# Patient Record
Sex: Female | Born: 2000 | Hispanic: Yes | Smoking: Former smoker
Health system: Southern US, Community
[De-identification: ages and names within clinical notes are randomized; demographics above are authoritative.]

## PROBLEM LIST (undated history)

## (undated) ENCOUNTER — Inpatient Hospital Stay (HOSPITAL_COMMUNITY): Payer: Self-pay

## (undated) DIAGNOSIS — A749 Chlamydial infection, unspecified: Secondary | ICD-10-CM

## (undated) DIAGNOSIS — Z789 Other specified health status: Secondary | ICD-10-CM

## (undated) HISTORY — PX: NO PAST SURGERIES: SHX2092

## (undated) HISTORY — DX: Chlamydial infection, unspecified: A74.9

---
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
## 2019-04-06 IMAGING — US US MFM FETAL NUCHAL TRANSLUCENCY
1 series · 14 of 28 positions shown · non-contrast
Comparison: none

[Series 1: us mfm fetal nuchal translucency · 14 of 43 slices shown]
[im 2/43]
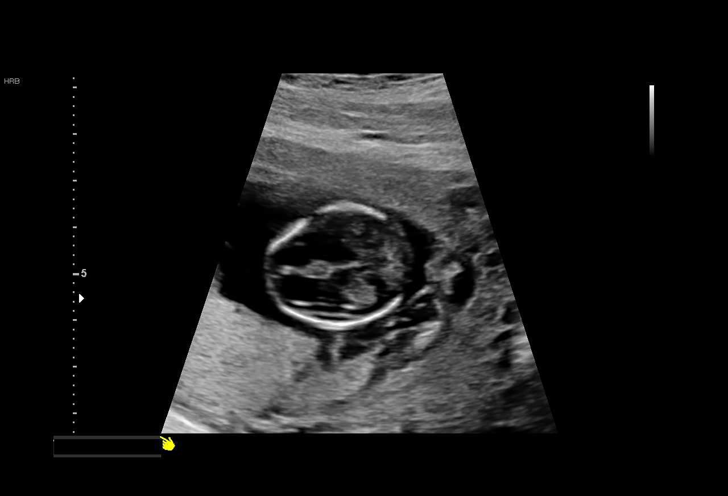
[im 5/43]
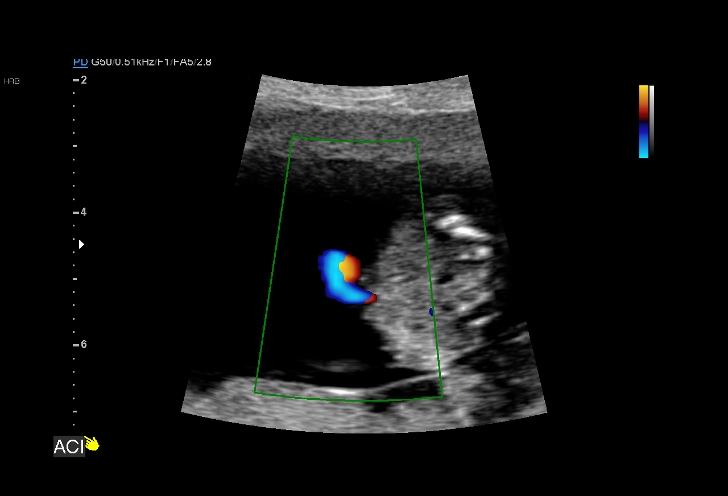
[im 8/43]
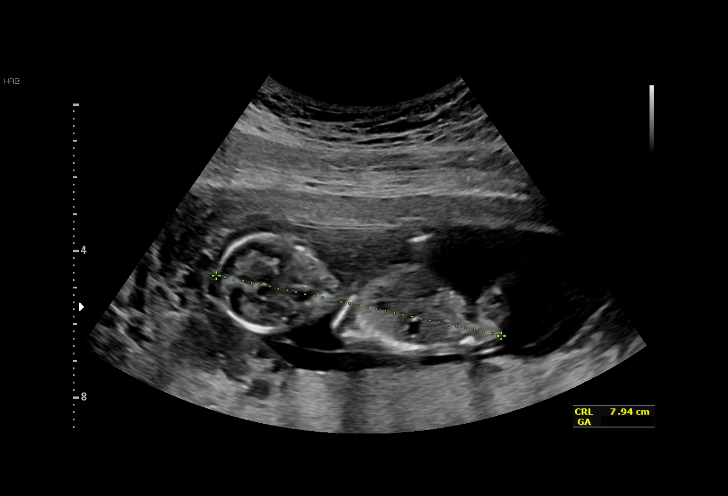
[im 11/43]
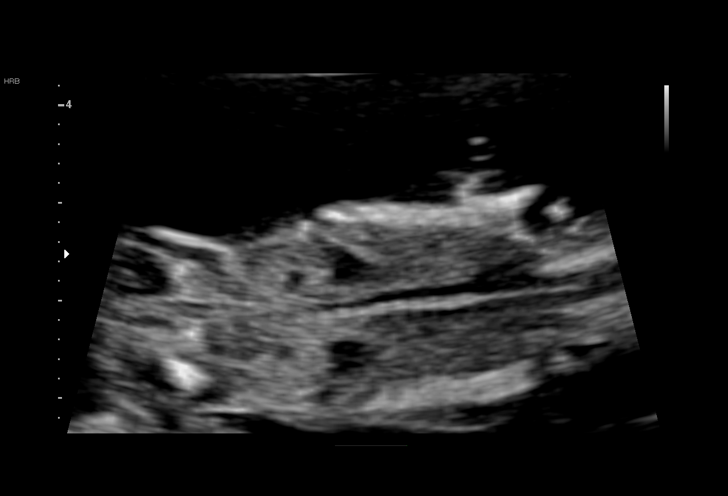
[im 15/43]
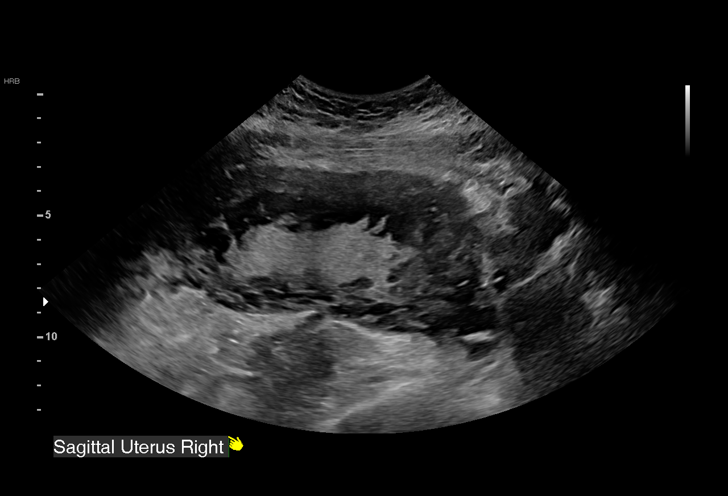
[im 18/43]
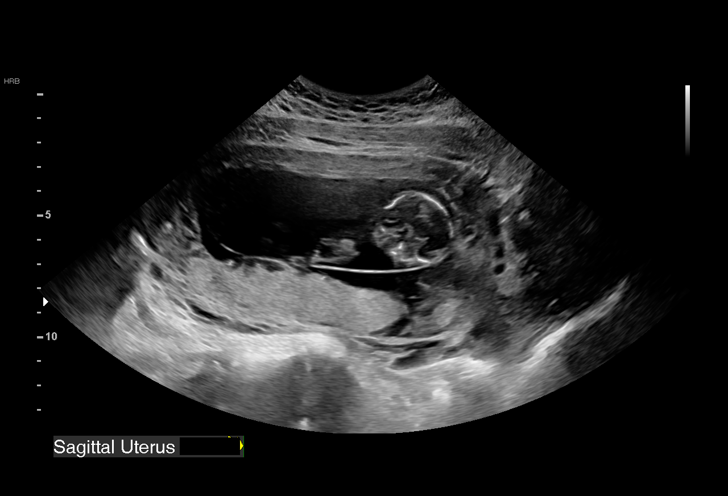
[im 21/43]
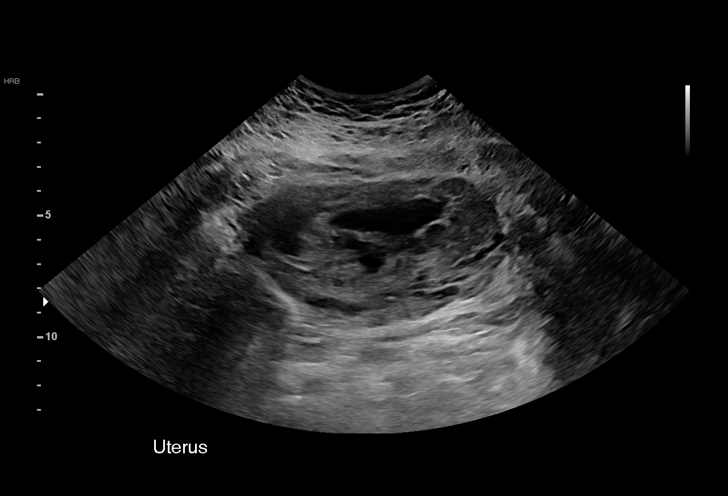
[im 24/43]
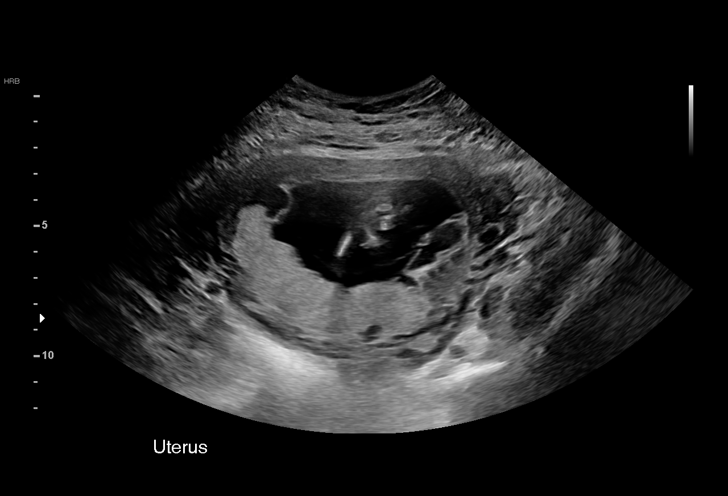
[im 27/43]
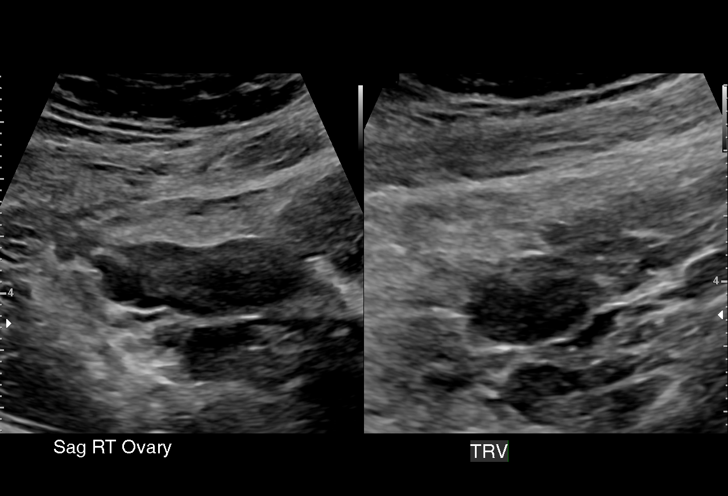
[im 30/43]
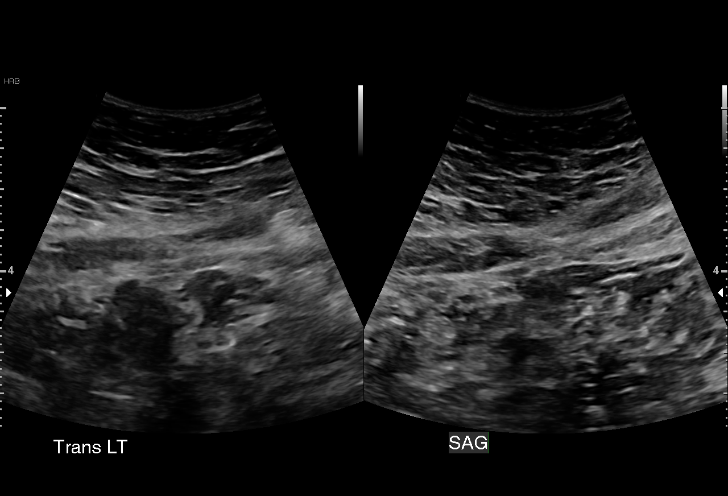
[im 33/43]
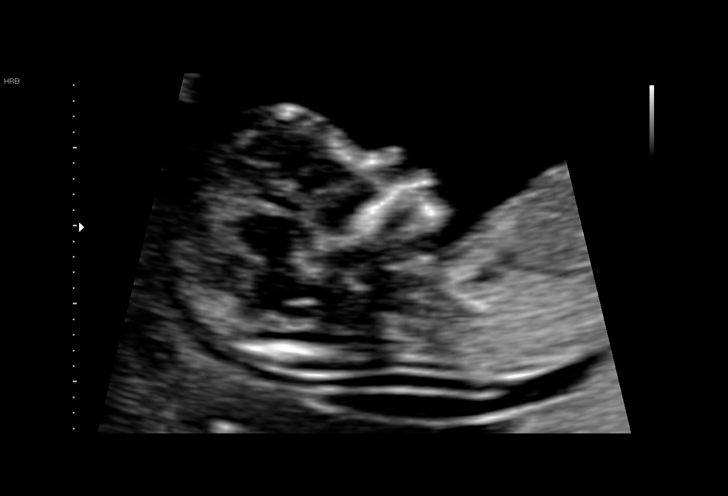
[im 36/43]
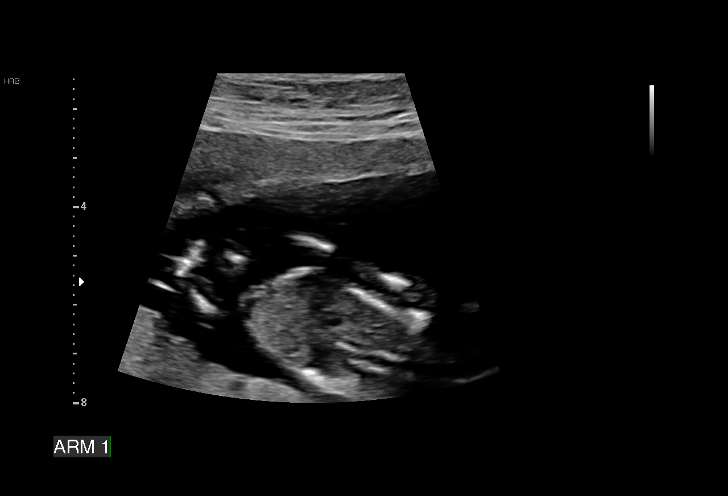
[im 39/43]
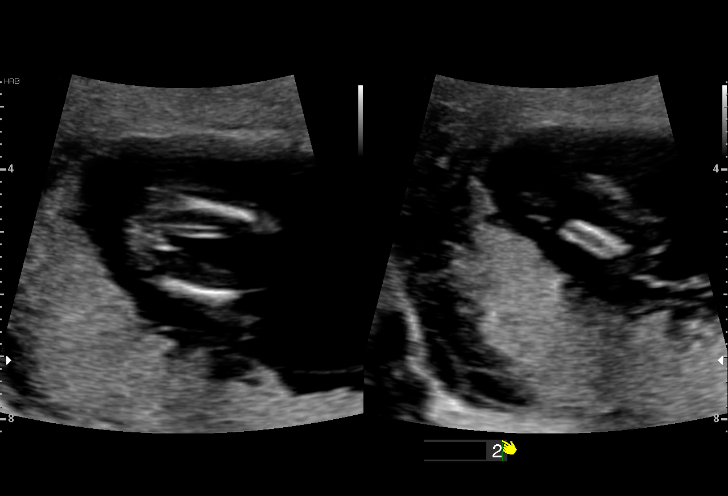
[im 43/43]
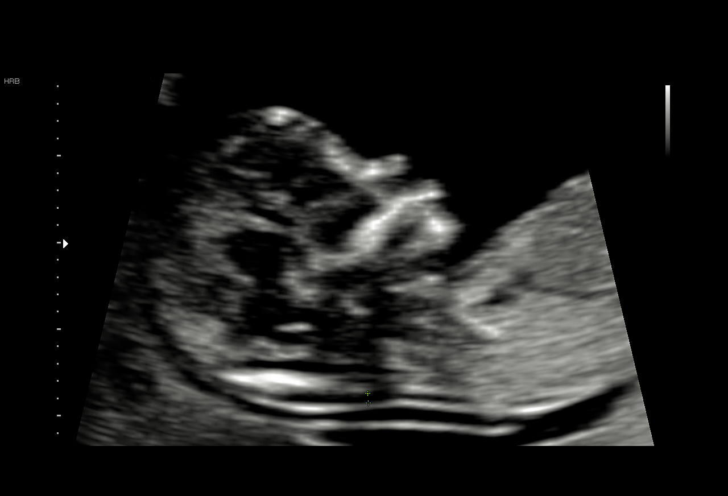

[14 of 28 positions shown; findings below may reference images not displayed]

SINGH GARY

GILBERG NP

TRANSLUCENCY

Indications

13 weeks gestation of pregnancy
Encounter for nuchal translucency
Short interval between pregancies, 1st
trimester
Vital Signs

BMI:
Fetal Evaluation

Num Of Fetuses:         1
Fetal Heart Rate(bpm):  162
Cardiac Activity:       Observed

Amniotic Fluid
AFI FV:      Within normal limits
Biometry

CRL:      78.8  mm     G. Age:  13w 4d                  EDD:   11/22/18
OB History

Gravidity:    2         Term:   0         SAB:   1
Living:       0
Gestational Age
LMP:           13w 0d        Date:  02/19/18                 EDD:   11/26/18
Best:          13w 0d     Det. By:  LMP  (02/19/18)          EDD:   11/26/18
1st Trimester Genetic Sonogram Screening

CRL:            78.8  mm    G. Age:   13w 4d                 EDD:   11/22/18
Nuc Trans:       1.2  mm

Nasal Bone:                 Present
Anatomy

Cranium:               Appears normal         Abdomen:                Appears normal
Cavum:                 Appears normal         Abdominal Wall:         Appears nml (cord
insert, abd wall)
Choroid Plexus:        Appears normal         Cord Vessels:           Appears normal (3
vessel cord)
Thoracic:              Appears normal         Kidneys:                Appear normal
Stomach:               Appears normal, left   Bladder:                Appears normal
sided
Cervix Uterus Adnexa

Cervix
Normal appearance by transabdominal scan.

Uterus
No abnormality visualized.

Left Ovary
Not visualized. No adnexal mass visualized.

Right Ovary
Size(cm)     3.63   x   1.34   x  1.76      Vol(ml):
Within normal limits.

Cul De Sac
No free fluid seen.

Adnexa
No abnormality visualized.
Impression

On ultrasound, the CRL measurement is consistent with her
previously-established dates and good fetal heart activity is
seen. The nuchal translucency (NT) measures
millimeters, which is normal.  Fetal anatomy that could be
ascertained at this gestational age is normal.

Blood was drawn for LICHT and beta hCG.

We will communicate first-trimester screening results to the
patient.
Recommendations

Fetal anatomy scan at 20 weeks.

## 2020-09-22 NOTE — L&D Delivery Note (Signed)
OB/GYN Faculty Practice Delivery Note  Sara Stokes is a 20 y.o. now [id number] s/p SVD at [redacted]w[redacted]d who was admitted for SROM @ 0630.   ROM: 9h 97m with clear fluid GBS Status: Positive Maximum Maternal Temperature: 99.1  Delivery Note At 4:22 PM a viable female was delivered via Vaginal, Spontaneous (Presentation: Left Occiput Anterior). Tight nuchal cord, unable to reduce until delivery of head and shoulders.  APGAR: 9, 9; weight - pending. Gentle cord traction applied until blood noted to be collecting in between membranes and placenta body. At which point patient was instructed to push for delivery of placenta.   At 4:30 PM placenta pushed out spontaneously partially adherent to an inverted uterus. *Drs. Eckstat and Pickens called to come to bedside stat for inverted uterus. Placenta to pathology.  Cord: 3 vessels with the following complications: None.  Cord pH: n/a.   Anesthesia: Epidural Episiotomy: None Lacerations: None Suture Repair: N/A Est. Blood Loss (mL): 2000  Postpartum Planning [x]  Mom to postpartum.  Baby to Couplet care / Skin to Skin.  [x]  plans to breast/bottle feed Declined vaccines  *See documentation by Dr. regarding replacement of inverted uterus and treatment of hemorrhage.  , MSN, CNM 10/10/20, 5:16 PM

## 2020-10-10 ENCOUNTER — Observation Stay (HOSPITAL_COMMUNITY): Payer: Medicaid Other | Admitting: Anesthesiology

## 2020-10-10 ENCOUNTER — Inpatient Hospital Stay (HOSPITAL_COMMUNITY)
Admission: AD | Admit: 2020-10-10 | Discharge: 2020-10-11 | DRG: 806 | Disposition: A | Discharge status: Home / Self Care | Payer: Medicaid Other | Attending: Family Medicine | Admitting: Family Medicine

## 2020-10-10 ENCOUNTER — Other Ambulatory Visit: Payer: Self-pay

## 2020-10-10 ENCOUNTER — Encounter (HOSPITAL_COMMUNITY): Payer: Self-pay | Admitting: Obstetrics and Gynecology

## 2020-10-10 DIAGNOSIS — O4292 Full-term premature rupture of membranes, unspecified as to length of time between rupture and onset of labor: Secondary | ICD-10-CM | POA: Diagnosis present

## 2020-10-10 DIAGNOSIS — Z23 Encounter for immunization: Secondary | ICD-10-CM | POA: Diagnosis not present

## 2020-10-10 DIAGNOSIS — O99824 Streptococcus B carrier state complicating childbirth: Secondary | ICD-10-CM | POA: Diagnosis present

## 2020-10-10 DIAGNOSIS — Z3A38 38 weeks gestation of pregnancy: Secondary | ICD-10-CM

## 2020-10-10 DIAGNOSIS — Z20822 Contact with and (suspected) exposure to covid-19: Secondary | ICD-10-CM | POA: Diagnosis present

## 2020-10-10 DIAGNOSIS — O26893 Other specified pregnancy related conditions, third trimester: Secondary | ICD-10-CM | POA: Diagnosis present

## 2020-10-10 DIAGNOSIS — N855 Inversion of uterus: Secondary | ICD-10-CM | POA: Diagnosis not present

## 2020-10-10 DIAGNOSIS — Z8759 Personal history of other complications of pregnancy, childbirth and the puerperium: Secondary | ICD-10-CM | POA: Diagnosis not present

## 2020-10-10 DIAGNOSIS — Z3490 Encounter for supervision of normal pregnancy, unspecified, unspecified trimester: Secondary | ICD-10-CM | POA: Diagnosis present

## 2020-10-10 DIAGNOSIS — O4202 Full-term premature rupture of membranes, onset of labor within 24 hours of rupture: Secondary | ICD-10-CM

## 2020-10-10 DIAGNOSIS — O99820 Streptococcus B carrier state complicating pregnancy: Secondary | ICD-10-CM

## 2020-10-10 DIAGNOSIS — Z87891 Personal history of nicotine dependence: Secondary | ICD-10-CM

## 2020-10-10 LAB — URINALYSIS, ROUTINE W REFLEX MICROSCOPIC
Bilirubin Urine: NEGATIVE
Glucose, UA: NEGATIVE mg/dL
Ketones, ur: NEGATIVE mg/dL
Nitrite: NEGATIVE
Protein, ur: NEGATIVE mg/dL
Specific Gravity, Urine: 1.016 (ref 1.005–1.030)
pH: 6 (ref 5.0–8.0)

## 2020-10-10 LAB — CBC
HCT: 40.1 % (ref 36.0–46.0)
HCT: 32.3 % — ABNORMAL LOW (ref 36.0–46.0)
HCT: 32.6 % — ABNORMAL LOW (ref 36.0–46.0)
Hemoglobin: 13.1 g/dL (ref 12.0–15.0)
Hemoglobin: 11 g/dL — ABNORMAL LOW (ref 12.0–15.0)
Hemoglobin: 11.5 g/dL — ABNORMAL LOW (ref 12.0–15.0)
MCH: 29.2 pg (ref 26.0–34.0)
MCH: 30.6 pg (ref 26.0–34.0)
MCH: 30.8 pg (ref 26.0–34.0)
MCHC: 32.7 g/dL (ref 30.0–36.0)
MCHC: 34.1 g/dL (ref 30.0–36.0)
MCHC: 35.3 g/dL (ref 30.0–36.0)
MCV: 89.5 fL (ref 80.0–100.0)
MCV: 89.7 fL (ref 80.0–100.0)
MCV: 87.4 fL (ref 80.0–100.0)
Platelets: 241 10*3/uL (ref 150–400)
Platelets: 248 10*3/uL (ref 150–400)
Platelets: 187 10*3/uL (ref 150–400)
RBC: 4.48 MIL/uL (ref 3.87–5.11)
RBC: 3.6 MIL/uL — ABNORMAL LOW (ref 3.87–5.11)
RBC: 3.73 MIL/uL — ABNORMAL LOW (ref 3.87–5.11)
RDW: 12.9 % (ref 11.5–15.5)
RDW: 12.9 % (ref 11.5–15.5)
RDW: 13.2 % (ref 11.5–15.5)
WBC: 11.1 10*3/uL — ABNORMAL HIGH (ref 4.0–10.5)
WBC: 12 10*3/uL — ABNORMAL HIGH (ref 4.0–10.5)
WBC: 18.4 10*3/uL — ABNORMAL HIGH (ref 4.0–10.5)
nRBC: 0 % (ref 0.0–0.2)
nRBC: 0 % (ref 0.0–0.2)
nRBC: 0 % (ref 0.0–0.2)

## 2020-10-10 LAB — OB RESULTS CONSOLE RUBELLA ANTIBODY, IGM: Rubella: NON-IMMUNE/NOT IMMUNE

## 2020-10-10 LAB — OB RESULTS CONSOLE HIV ANTIBODY (ROUTINE TESTING): HIV: NONREACTIVE

## 2020-10-10 LAB — COMPREHENSIVE METABOLIC PANEL WITH GFR
ALT: 23 U/L (ref 0–44)
AST: 24 U/L (ref 15–41)
Albumin: 2.6 g/dL — ABNORMAL LOW (ref 3.5–5.0)
Alkaline Phosphatase: 181 U/L — ABNORMAL HIGH (ref 38–126)
Anion gap: 9 (ref 5–15)
BUN: 7 mg/dL (ref 6–20)
CO2: 18 mmol/L — ABNORMAL LOW (ref 22–32)
Calcium: 8.5 mg/dL — ABNORMAL LOW (ref 8.9–10.3)
Chloride: 107 mmol/L (ref 98–111)
Creatinine, Ser: 0.46 mg/dL (ref 0.44–1.00)
GFR, Estimated: >60 mL/min
Glucose, Bld: 98 mg/dL (ref 70–99)
Potassium: 3.8 mmol/L (ref 3.5–5.1)
Sodium: 134 mmol/L — ABNORMAL LOW (ref 135–145)
Total Bilirubin: 0.6 mg/dL (ref 0.3–1.2)
Total Protein: 6.3 g/dL — ABNORMAL LOW (ref 6.5–8.1)

## 2020-10-10 LAB — POCT FERN TEST: POCT Fern Test: POSITIVE

## 2020-10-10 LAB — PROTIME-INR
INR: 1.1 (ref 0.8–1.2)
Prothrombin Time: 13.5 seconds (ref 11.4–15.2)

## 2020-10-10 LAB — PREPARE RBC (CROSSMATCH)

## 2020-10-10 LAB — RESP PANEL BY RT-PCR (FLU A&B, COVID) ARPGX2
Influenza A by PCR: NEGATIVE
Influenza B by PCR: NEGATIVE
SARS Coronavirus 2 by RT PCR: NEGATIVE

## 2020-10-10 LAB — OB RESULTS CONSOLE RPR: RPR: NONREACTIVE

## 2020-10-10 LAB — OB RESULTS CONSOLE ANTIBODY SCREEN: Antibody Screen: NEGATIVE

## 2020-10-10 LAB — OB RESULTS CONSOLE ABO/RH: RH Type: POSITIVE

## 2020-10-10 LAB — SAVE SMEAR(SSMR), FOR PROVIDER SLIDE REVIEW

## 2020-10-10 LAB — FIBRINOGEN: Fibrinogen: 530 mg/dL — ABNORMAL HIGH (ref 210–475)

## 2020-10-10 LAB — OB RESULTS CONSOLE HEPATITIS B SURFACE ANTIGEN: Hepatitis B Surface Ag: NEGATIVE

## 2020-10-10 LAB — PROTEIN / CREATININE RATIO, URINE
Creatinine, Urine: 28.67 mg/dL
Total Protein, Urine: <6 mg/dL

## 2020-10-10 LAB — OB RESULTS CONSOLE GBS: GBS: POSITIVE

## 2020-10-10 LAB — RPR: RPR Ser Ql: NONREACTIVE

## 2020-10-10 LAB — APTT: aPTT: 26 seconds (ref 24–36)

## 2020-10-10 MED ORDER — AMMONIA AROMATIC IN INHA
RESPIRATORY_TRACT | Status: AC
  Filled 2020-10-10: qty 10

## 2020-10-10 MED ORDER — CEFAZOLIN SODIUM-DEXTROSE 2-4 GM/100ML-% IV SOLN
2.0000 g | Freq: Once | INTRAVENOUS | Status: AC
  Administered 2020-10-10: 2 g via INTRAVENOUS
  Filled 2020-10-10: qty 100

## 2020-10-10 MED ORDER — PRENATAL MULTIVITAMIN CH
1.0000 | ORAL_TABLET | Freq: Every day | ORAL | Status: DC
  Administered 2020-10-11: 1 via ORAL
  Filled 2020-10-10: qty 1

## 2020-10-10 MED ORDER — FERROUS SULFATE 325 (65 FE) MG PO TABS
325.0000 mg | ORAL_TABLET | Freq: Two times a day (BID) | ORAL | Status: DC
  Administered 2020-10-11 (×2): 325 mg via ORAL
  Filled 2020-10-10 (×2): qty 1

## 2020-10-10 MED ORDER — TRANEXAMIC ACID-NACL 1000-0.7 MG/100ML-% IV SOLN
INTRAVENOUS | Status: AC
  Filled 2020-10-10: qty 100

## 2020-10-10 MED ORDER — COCONUT OIL OIL: 1.0000 "application " | TOPICAL_OIL | Status: DC | PRN

## 2020-10-10 MED ORDER — SODIUM CHLORIDE 0.9 % IV SOLN
5.0000 10*6.[IU] | Freq: Once | INTRAVENOUS | Status: AC
  Administered 2020-10-10: 5 10*6.[IU] via INTRAVENOUS
  Filled 2020-10-10: qty 5

## 2020-10-10 MED ORDER — PENICILLIN G POT IN DEXTROSE 60000 UNIT/ML IV SOLN: 3.0000 10*6.[IU] | INTRAVENOUS | Status: DC

## 2020-10-10 MED ORDER — METHYLERGONOVINE MALEATE 0.2 MG PO TABS
0.2000 mg | ORAL_TABLET | Freq: Four times a day (QID) | ORAL | Status: AC
  Administered 2020-10-10 – 2020-10-11 (×3): 0.2 mg via ORAL
  Filled 2020-10-10 (×3): qty 1

## 2020-10-10 MED ORDER — FENTANYL-BUPIVACAINE-NACL 0.5-0.125-0.9 MG/250ML-% EP SOLN
12.0000 mL/h | EPIDURAL | Status: DC | PRN
Start: 2020-10-10 — End: 2020-10-10
  Filled 2020-10-10: qty 250

## 2020-10-10 MED ORDER — METHYLERGONOVINE MALEATE 0.2 MG/ML IJ SOLN
0.2000 mg | Freq: Once | INTRAMUSCULAR | Status: AC
  Administered 2020-10-10: 0.2 mg via INTRAMUSCULAR

## 2020-10-10 MED ORDER — DIBUCAINE (PERIANAL) 1 % EX OINT: 1.0000 "application " | TOPICAL_OINTMENT | CUTANEOUS | Status: DC | PRN

## 2020-10-10 MED ORDER — FENTANYL-BUPIVACAINE-NACL 0.5-0.125-0.9 MG/250ML-% EP SOLN: 12.0000 mL/h | EPIDURAL | Status: DC | PRN

## 2020-10-10 MED ORDER — OXYTOCIN-SODIUM CHLORIDE 30-0.9 UT/500ML-% IV SOLN
2.5000 [IU]/h | INTRAVENOUS | Status: DC
  Filled 2020-10-10: qty 500

## 2020-10-10 MED ORDER — ONDANSETRON HCL 4 MG PO TABS: 4.0000 mg | ORAL_TABLET | ORAL | Status: DC | PRN

## 2020-10-10 MED ORDER — ZOLPIDEM TARTRATE 5 MG PO TABS: 5.0000 mg | ORAL_TABLET | Freq: Every evening | ORAL | Status: DC | PRN

## 2020-10-10 MED ORDER — ACETAMINOPHEN 325 MG PO TABS: 650.0000 mg | ORAL_TABLET | ORAL | Status: DC | PRN

## 2020-10-10 MED ORDER — OXYCODONE-ACETAMINOPHEN 5-325 MG PO TABS: 1.0000 | ORAL_TABLET | ORAL | Status: DC | PRN

## 2020-10-10 MED ORDER — PHENYLEPHRINE 40 MCG/ML (10ML) SYRINGE FOR IV PUSH (FOR BLOOD PRESSURE SUPPORT): 80.0000 ug | PREFILLED_SYRINGE | INTRAVENOUS | Status: DC | PRN

## 2020-10-10 MED ORDER — EPHEDRINE 5 MG/ML INJ: 10.0000 mg | INTRAVENOUS | Status: DC | PRN

## 2020-10-10 MED ORDER — ONDANSETRON HCL 4 MG/2ML IJ SOLN: 4.0000 mg | INTRAMUSCULAR | Status: DC | PRN

## 2020-10-10 MED ORDER — TRANEXAMIC ACID-NACL 1000-0.7 MG/100ML-% IV SOLN
1000.0000 mg | Freq: Once | INTRAVENOUS | Status: AC
  Administered 2020-10-10: 1000 mg via INTRAVENOUS

## 2020-10-10 MED ORDER — IBUPROFEN 600 MG PO TABS
600.0000 mg | ORAL_TABLET | Freq: Four times a day (QID) | ORAL | Status: DC
  Administered 2020-10-10 – 2020-10-11 (×4): 600 mg via ORAL
  Filled 2020-10-10 (×4): qty 1

## 2020-10-10 MED ORDER — ONDANSETRON HCL 4 MG/2ML IJ SOLN
4.0000 mg | Freq: Four times a day (QID) | INTRAMUSCULAR | Status: DC | PRN
  Administered 2020-10-10 (×2): 4 mg via INTRAVENOUS
  Filled 2020-10-10: qty 2

## 2020-10-10 MED ORDER — LIDOCAINE HCL (PF) 1 % IJ SOLN
INTRAMUSCULAR | Status: DC | PRN
  Administered 2020-10-10: 8 mL via EPIDURAL

## 2020-10-10 MED ORDER — TETANUS-DIPHTH-ACELL PERTUSSIS 5-2.5-18.5 LF-MCG/0.5 IM SUSY: 0.5000 mL | PREFILLED_SYRINGE | Freq: Once | INTRAMUSCULAR | Status: DC

## 2020-10-10 MED ORDER — LACTATED RINGERS IV SOLN: 500.0000 mL | INTRAVENOUS | Status: DC | PRN

## 2020-10-10 MED ORDER — PENICILLIN G POT IN DEXTROSE 60000 UNIT/ML IV SOLN
3.0000 10*6.[IU] | INTRAVENOUS | Status: DC
  Administered 2020-10-10: 3 10*6.[IU] via INTRAVENOUS
  Filled 2020-10-10: qty 50

## 2020-10-10 MED ORDER — METHYLERGONOVINE MALEATE 0.2 MG/ML IJ SOLN
INTRAMUSCULAR | Status: AC
  Filled 2020-10-10: qty 1

## 2020-10-10 MED ORDER — PHENYLEPHRINE 40 MCG/ML (10ML) SYRINGE FOR IV PUSH (FOR BLOOD PRESSURE SUPPORT)
80.0000 ug | PREFILLED_SYRINGE | INTRAVENOUS | Status: DC | PRN
  Filled 2020-10-10: qty 10

## 2020-10-10 MED ORDER — BENZOCAINE-MENTHOL 20-0.5 % EX AERO: 1.0000 "application " | INHALATION_SPRAY | CUTANEOUS | Status: DC | PRN

## 2020-10-10 MED ORDER — OXYCODONE-ACETAMINOPHEN 5-325 MG PO TABS: 2.0000 | ORAL_TABLET | ORAL | Status: DC | PRN

## 2020-10-10 MED ORDER — DIPHENHYDRAMINE HCL 25 MG PO CAPS: 25.0000 mg | ORAL_CAPSULE | Freq: Four times a day (QID) | ORAL | Status: DC | PRN

## 2020-10-10 MED ORDER — SOD CITRATE-CITRIC ACID 500-334 MG/5ML PO SOLN: 30.0000 mL | ORAL | Status: DC | PRN

## 2020-10-10 MED ORDER — SODIUM CHLORIDE 0.9% IV SOLUTION: Freq: Once | INTRAVENOUS | Status: AC

## 2020-10-10 MED ORDER — SENNOSIDES-DOCUSATE SODIUM 8.6-50 MG PO TABS
2.0000 | ORAL_TABLET | Freq: Every day | ORAL | Status: DC
  Administered 2020-10-11: 2 via ORAL
  Filled 2020-10-10: qty 2

## 2020-10-10 MED ORDER — OXYTOCIN-SODIUM CHLORIDE 30-0.9 UT/500ML-% IV SOLN
1.0000 m[IU]/min | INTRAVENOUS | Status: DC
  Administered 2020-10-10: 2 m[IU]/min via INTRAVENOUS
  Filled 2020-10-10: qty 500

## 2020-10-10 MED ORDER — LACTATED RINGERS IV SOLN: INTRAVENOUS | Status: DC

## 2020-10-10 MED ORDER — TERBUTALINE SULFATE 1 MG/ML IJ SOLN: 0.2500 mg | Freq: Once | INTRAMUSCULAR | Status: DC | PRN

## 2020-10-10 MED ORDER — WITCH HAZEL-GLYCERIN EX PADS: 1.0000 "application " | MEDICATED_PAD | CUTANEOUS | Status: DC | PRN

## 2020-10-10 MED ORDER — SIMETHICONE 80 MG PO CHEW: 80.0000 mg | CHEWABLE_TABLET | ORAL | Status: DC | PRN

## 2020-10-10 MED ORDER — LACTATED RINGERS IV SOLN: 500.0000 mL | Freq: Once | INTRAVENOUS | Status: DC

## 2020-10-10 MED ORDER — SODIUM CHLORIDE (PF) 0.9 % IJ SOLN
INTRAMUSCULAR | Status: DC | PRN
  Administered 2020-10-10: 12 mL/h via EPIDURAL

## 2020-10-10 MED ORDER — PENICILLIN G POTASSIUM 5000000 UNITS IJ SOLR: 5.0000 10*6.[IU] | INTRAMUSCULAR | Status: DC

## 2020-10-10 MED ORDER — DIPHENHYDRAMINE HCL 50 MG/ML IJ SOLN: 12.5000 mg | INTRAMUSCULAR | Status: DC | PRN

## 2020-10-10 MED ORDER — OXYTOCIN-SODIUM CHLORIDE 30-0.9 UT/500ML-% IV SOLN: 10.0000 [IU]/h | INTRAVENOUS | Status: DC

## 2020-10-10 MED ORDER — OXYTOCIN BOLUS FROM INFUSION
333.0000 mL | Freq: Once | INTRAVENOUS | Status: AC
  Administered 2020-10-10: 250 mL via INTRAVENOUS

## 2020-10-10 MED ORDER — LIDOCAINE HCL (PF) 1 % IJ SOLN: 30.0000 mL | INTRAMUSCULAR | Status: DC | PRN

## 2020-10-10 NOTE — Anesthesia Preprocedure Evaluation (Signed)

## 2020-10-10 NOTE — Anesthesia Postprocedure Evaluation (Signed)
Anesthesia Post Note  Patient: Sara Stokes  Procedure(s) Performed: AN AD HOC LABOR EPIDURAL     Patient location during evaluation: Mother Baby Anesthesia Type: Epidural Level of consciousness: awake and alert Pain management: pain level controlled Vital Signs Assessment: post-procedure vital signs reviewed and stable Respiratory status: spontaneous breathing, nonlabored ventilation and respiratory function stable Cardiovascular status: stable Postop Assessment: no headache, no backache and epidural receding Anesthetic complications: no   No complications documented.  Last Vitals:  Vitals:   10/10/20 1537 10/10/20 1604  BP: (!) 107/56 131/74  Pulse: 92 (!) 104  Resp: 18 18  Temp:    SpO2:      Last Pain:  Vitals:   10/10/20 1320  TempSrc: Oral  PainSc:    Pain Goal:                   Evonda Enge

## 2020-10-10 NOTE — Discharge Summary (Signed)
Postpartum Discharge Summary  Date of Service updated 10/11/20     Patient Name: Sara Stokes DOB: 08-01-2001 MRN: [id number]  Date of admission: 10/10/2020 Delivery date:10/10/2020  Delivering provider: Laury Deep  Date of discharge: 10/11/2020  Admitting diagnosis: Labor and delivery, indication for care [O75.9] Encounter for induction of labor [Z34.90] Intrauterine pregnancy: [redacted]w[redacted]d    Secondary diagnosis:  Active Problems:   Labor and delivery, indication for care   Encounter for induction of labor   Uterine inversion   Postpartum hemorrhage   Vaginal delivery  Additional problems: Uterine Inversion, postpartum hemorrhage    Discharge diagnosis: Term Pregnancy Delivered and PCochran                                             Post partum procedures:blood transfusion Augmentation: Pitocin Complications: Uterine Inversion and Hemorrhage>10068m Hospital course: Onset of Labor With Vaginal Delivery      [age].o. yo G3P1011 at 3831w6ds admitted in Latent Labor on 10/10/2020. Patient had an uncomplicated labor course as follows:  Membrane Rupture Time/Date: 6:30 AM ,10/10/2020   Delivery Method:Vaginal, Spontaneous  Episiotomy: None  Lacerations:  None  Patient had an uncomplicated postpartum course.  She is ambulating, tolerating a regular diet, passing flatus, and urinating well. Patient is discharged home in stable condition on 10/11/20.  Newborn Data: Birth date:10/10/2020  Birth time:4:22 PM  Gender:Female  Living status:Living  Apgars:9 ,9  Weight:3240 g   Magnesium Sulfate received: No BMZ received: No Rhophylac:N/A MMR:Yes , given postpartum T-DaP:Given prenatally per patient report Flu: No Transfusion:Yes  Physical exam  Vitals:   10/10/20 2000 10/10/20 2048 10/11/20 0115 10/11/20 0500  BP: (!) 114/58 104/64 104/67 118/62  Pulse: 88 (!) 101 85 96  Resp: 18 16 18 20   Temp: 98.2 F (36.8 C) 97.8 F (36.6 C) 98.6 F (37 C) 98.6 F (37 C)   TempSrc: Oral Oral Oral Oral  SpO2: 100% 100% 99% 100%  Weight:      Height:       General: alert, cooperative and no distress Lochia: appropriate Uterine Fundus: firm Incision: N/A DVT Evaluation: No evidence of DVT seen on physical exam. Labs: Lab Results  Component Value Date   WBC 18.4 (H) 10/10/2020   HGB 11.5 (L) 10/10/2020   HCT 32.6 (L) 10/10/2020   MCV 87.4 10/10/2020   PLT 187 10/10/2020   CMP Latest Ref Rng & Units 10/10/2020  Glucose 70 - 99 mg/dL 98  BUN 6 - 20 mg/dL 7  Creatinine 0.44 - 1.00 mg/dL 0.46  Sodium 135 - 145 mmol/L 134(L)  Potassium 3.5 - 5.1 mmol/L 3.8  Chloride 98 - 111 mmol/L 107  CO2 22 - 32 mmol/L 18(L)  Calcium 8.9 - 10.3 mg/dL 8.5(L)  Total Protein 6.5 - 8.1 g/dL 6.3(L)  Total Bilirubin 0.3 - 1.2 mg/dL 0.6  Alkaline Phos 38 - 126 U/L 181(H)  AST 15 - 41 U/L 24  ALT 0 - 44 U/L 23   Edinburgh Score: Edinburgh Postnatal Depression Scale Screening Tool 10/10/2020  I have been able to laugh and see the funny side of things. 0  I have looked forward with enjoyment to things. 0  I have blamed myself unnecessarily when things went wrong. 0  I have been anxious or worried for no good reason. 0  I have felt  scared or panicky for no good reason. 0  Things have been getting on top of me. 1  I have been so unhappy that I have had difficulty sleeping. 0  I have felt sad or miserable. 0  I have been so unhappy that I have been crying. 0  The thought of harming myself has occurred to me. 0  Edinburgh Postnatal Depression Scale Total 1     After visit meds:  Allergies as of 10/11/2020   No Known Allergies     Medication List    STOP taking these medications   ferrous sulfate 325 (65 FE) MG tablet     TAKE these medications   acetaminophen 500 MG tablet Commonly known as: TYLENOL Take 2 tablets (1,000 mg total) by mouth every 6 (six) hours as needed for mild pain or moderate pain. What changed:   how much to take  reasons to take  this   coconut oil Oil Apply 1 application topically as needed.   ibuprofen 600 MG tablet Commonly known as: ADVIL Take 1 tablet (600 mg total) by mouth every 6 (six) hours as needed for mild pain or moderate pain. What changed:   medication strength  how much to take  when to take this  reasons to take this   PRENATAL VITAMIN PO Take by mouth.        Discharge home in stable condition Infant Feeding: Bottle and Breast Infant Disposition:home with mother Discharge instruction: per After Visit Summary and Postpartum booklet. Activity: Advance as tolerated. Pelvic rest for 6 weeks.  Diet: routine diet Future Appointments:No future appointments. Follow up Visit:  Follow-up Information    Department, [facility]. Schedule an appointment as soon as possible for a visit in 4 week(s).   Why: For postpartum visit Contact information: [address] [place] [address] [phone]                Please schedule this patient for a In person postpartum visit in 4 weeks with the following provider: MD. Additional Postpartum F/U:none  Low risk pregnancy complicated by: none Delivery mode:  Vaginal, Spontaneous  Anticipated Birth Control:  Depo, given prior to discharge   2/86/3817 Arrie Senate, MD

## 2020-10-10 NOTE — MAU Note (Signed)
Sara Stokes is a 20 y.o. at [redacted]w[redacted]d here in MAU reporting: LOF since 0630, fluid is clear. Is not wearing a pad but states leaking did soak through her clothes. Also reporting some cramping that started after LOF. Denies bleeding, +FM.   Onset of complaint: today  Pain score: 2/10  Vitals:   10/10/20 0734  BP: (!) 141/91  Pulse: (!) 117  Resp: 18  Temp: 99.1 F (37.3 C)  SpO2: 100%     FHT: +FM  Lab orders placed from triage: none, urine sample collected

## 2020-10-10 NOTE — H&P (Addendum)
OBSTETRIC ADMISSION HISTORY AND PHYSICAL  Sara Stokes is a 20 y.o. female G3P1011 with IUP at [redacted]w[redacted]d presenting for SROM @0630 . She reports +FMs. She notes that yesterday she has 2 episodes where she saw "lights" in her eyes but it has since stopped. She presented to the MAU with an elevated BP of 141/91, and she denies VB, blurry vision, headaches, peripheral edema, and RUQ pain. She stated she had not been checking her BP at home throughout pregnancy. She denies any complications with this pregnancy thus far including gestational DM and gHTN and she denies complications with her previous term pregnancy. She denies alcohol, tobacco, and drug use throughout this pregnancy. She plans on bottle feeding only. She requests Depo for birth control.  She received her [facility] at [facility]  Dating: By [facility] at [redacted]w[redacted]d >  Estimated Date of Delivery: 10/18/20  Sono:   @[redacted]w[redacted]d , anterior placenta, 64% EFW   Prenatal History/Complications:  GBS positive.  No complications with previous term pregnancy.    Past Medical History: Past Medical History:  Diagnosis Date   Chlamydia 12/25/2017   tx 11/2017   Medical history non-contributory     Past Surgical History: Past Surgical History:  Procedure Laterality Date   NO PAST SURGERIES      Obstetrical History: OB History     Gravida  3   Para  1   Term  1   Preterm      AB  1   Living  1      SAB  1   IAB      Ectopic      Multiple  0   Live Births  1           Social History Social History   Socioeconomic History   Marital status: Single    Spouse name: Not on file   Number of children: Not on file   Years of education: Not on file   Highest education level: Not on file  Occupational History   Not on file  Tobacco Use   Smoking status: Former Smoker    Quit date: 12/19/2017    Years since quitting: 2.8   Smokeless tobacco: Never Used   Tobacco comment: Marijuana  Substance and Sexual Activity   Alcohol  use: Not Currently   Drug use: Not Currently    Types: Marijuana   Sexual activity: Not Currently    Birth control/protection: None  Other Topics Concern   Not on file  Social History Narrative   Not on file   Social Determinants of Health   Financial Resource Strain: Not on file  Food Insecurity: Not on file  Transportation Needs: Not on file  Physical Activity: Not on file  Stress: Not on file  Social Connections: Not on file    Family History: History reviewed. No pertinent family history.  Allergies: No Known Allergies  Medications Prior to Admission  Medication Sig Dispense Refill Last Dose   acetaminophen (TYLENOL) 500 MG tablet Take 500 mg by mouth every 6 (six) hours as needed for headache.      ferrous sulfate 325 (65 FE) MG tablet Take 1 tablet (325 mg total) by mouth 2 (two) times daily with a meal. 60 tablet 0    ibuprofen (ADVIL,MOTRIN) 800 MG tablet Take 1 tablet (800 mg total) by mouth 3 (three) times daily. 30 tablet 0    Prenatal Vit-Fe Fumarate-FA (PRENATAL VITAMIN PO) Take by mouth.  Review of Systems  General: denies HA  HEENT: affirms spots of light in vision yesterday, denies vision changes today Abdomen: denies abdominal pain Reproductive: affirms LOF, denies vaginal bleeding and contractions  Extremities: denies edema   Blood pressure (!) 142/96, pulse (!) 130, temperature 99.1 F (37.3 C), temperature source Oral, resp. rate 18, height 5\' 6"  (1.676 m), weight 93.6 kg, SpO2 99 %, unknown if currently breastfeeding.   General appearance: alert, cooperative and no distress Lungs: normal respiratory effort, fields clear to auscultation bilaterally  Heart: regular rate and rhythm Abdomen: soft, non-tender; gravid Extremities: Homans sign is negative, no sign of DVT, pedal pulses +2 bilaterally  Fetal monitoringBaseline: 160 bpm, Accelerations: Reactive and Decelerations: Variable: mild Uterine activityNone   Prenatal labs: results per [facility]  report  ABO, Rh:  O+ Antibody:  negative  Rubella:  non-immune  RPR:  nonreactive  HBsAg:  nonreactive  HIV:  nonreactive GBS:  positive  1 hr Glucola 123  Prenatal Transfer Tool  Maternal Diabetes: No Genetic Screening: Normal Maternal Ultrasounds/Referrals: Normal Fetal Ultrasounds or other Referrals:  None Maternal Substance Abuse:  No Significant Maternal Medications:  None Significant Maternal Lab Results: Group B Strep positive  No results found for this or any previous visit (from the past 24 hour(s)).  Patient Active Problem List   Diagnosis Date Noted   History of marijuana use 11/22/2018   Indication for care in labor or delivery 11/21/2018   Chlamydia 12/25/2017    Assessment/Plan:   ROM at [redacted]w[redacted]d [name] is a 20 y.o. G3P1011 at [redacted]w[redacted]d here for ROM @0630 . She reports no current pain or contractions. Start on Pitocin.  #PROM: clear fluid @0630  #Pain: PRN #FWB:  cat 2 #ID: GBS pos, PCN #MOF: bottle  #MOC: depo  GBS Positive  Penicillin G was started at 0909.   High Blood Pressure  Continue to monitor BP and patient will report new onset of symptoms of preeclampsia. Collected a urine protein/creatinine ratio.   [redacted]w[redacted]d, PA Student   Attestation of Supervision of Student:  I confirm that I have verified the information documented in the physician assistant student's note and that I have also personally reperformed the history, physical exam and all medical decision making activities.  I have verified that all services and findings are accurately documented in this student's note; and I agree with management and plan as outlined in the documentation. I have also made any necessary editorial changes.  , CNM [facility] , [facility] 10/10/2020 2:01 PM

## 2020-10-10 NOTE — Progress Notes (Incomplete)
UNMATCHED BLOOD PRODUCT NOTE  Compare the patient ID on the blood tag to the patient ID on the hospital armband and Blood Bank armband. Then confirm the unit number on the blood tag matches the unit number on the blood product.  If a discrepancy is discovered return the product to blood bank immediately.   Blood Product Type: {CHL IP BLOOD PRODUCTS:20952}  Unit #: (Found on blood product bag, begins with W) ***  Product Code #: (Found on blood product bag, begins with E) ***   Start Time: ***  Starting Rate: *** ml/hr  Rate increase/decreased  (if applicable):      ml/hr  Rate changed time (if applicable):    Stop Time: ***   All Other Documentation should be documented within the Blood Admin Flowsheet per policy.   

## 2020-10-10 NOTE — Anesthesia Procedure Notes (Signed)
Epidural Patient location during procedure: OB Start time: 10/10/2020 12:51 PM End time: 10/10/2020 12:56 PM  Staffing Anesthesiologist: Bethena Midget, MD  Preanesthetic Checklist Completed: patient identified, IV checked, site marked, risks and benefits discussed, surgical consent, monitors and equipment checked, pre-op evaluation and timeout performed  Epidural Patient position: sitting Prep: DuraPrep and site prepped and draped Patient monitoring: continuous pulse ox and blood pressure Approach: midline Location: L3-L4 Injection technique: LOR air  Needle:  Needle type: Tuohy  Needle gauge: 17 G Needle length: 9 cm and 9 Needle insertion depth: 6 cm Catheter type: closed end flexible Catheter size: 19 Gauge Catheter at skin depth: 11 cm Test dose: negative  Assessment Events: blood not aspirated, injection not painful, no injection resistance, no paresthesia and negative IV test

## 2020-10-11 ENCOUNTER — Other Ambulatory Visit: Payer: Self-pay | Admitting: Obstetrics and Gynecology

## 2020-10-11 DIAGNOSIS — N855 Inversion of uterus: Secondary | ICD-10-CM

## 2020-10-11 DIAGNOSIS — Z8759 Personal history of other complications of pregnancy, childbirth and the puerperium: Secondary | ICD-10-CM

## 2020-10-11 MED ORDER — IBUPROFEN 600 MG PO TABS: 600.0000 mg | ORAL_TABLET | Freq: Four times a day (QID) | ORAL | Status: DC | PRN

## 2020-10-11 MED ORDER — MEDROXYPROGESTERONE ACETATE 150 MG/ML IM SUSP
150.0000 mg | Freq: Once | INTRAMUSCULAR | Status: AC
  Administered 2020-10-11: 150 mg via INTRAMUSCULAR
  Filled 2020-10-11: qty 1

## 2020-10-11 MED ORDER — MEASLES, MUMPS & RUBELLA VAC IJ SOLR
0.5000 mL | Freq: Once | INTRAMUSCULAR | Status: AC
  Administered 2020-10-11: 0.5 mL via SUBCUTANEOUS
  Filled 2020-10-11: qty 0.5

## 2020-10-11 MED ORDER — COCONUT OIL OIL: 1.0000 "application " | TOPICAL_OIL | 0 refills | Status: DC | PRN

## 2020-10-11 MED ORDER — ACETAMINOPHEN 500 MG PO TABS: 1000.0000 mg | ORAL_TABLET | Freq: Four times a day (QID) | ORAL | 0 refills | Status: DC | PRN

## 2020-10-11 NOTE — Discharge Instructions (Signed)
-take tylenol 1000 mg every 6 hours as needed for pain, alternate with ibuprofen 600 mg every 6 hours -drink plenty of water to help with breastfeeding -continue prenatal vitamins while you are breastfeeding -depo every 3 months at health department -follow up at health department for postpartum visit in 4-6 weeks, please call to schedule appt   Parto vaginal, cuidados de puerperio Postpartum Care After Vaginal Delivery La siguiente informacin ofrece una gua sobre cmo cuidarse desde el momento en que nazca su beb y Sara Stokes 6 a 12 semanas despus del parto (perodo del posparto). Si tiene problemas o preguntas, pngase en contacto con su mdico para obtener instrucciones ms especficas. Siga estas instrucciones en su casa: Hemorragia vaginal  Es normal tener un poco de hemorragia vaginal (loquios) despus del parto. Use un apsito sanitario para el sangrado y secrecin. ? Durante la primera semana despus del parto, la cantidad y el aspecto de los loquios a menudo es similar a los del perodo menstrual. ? Durante las siguientes semanas disminuir gradualmente hasta convertirse en una secrecin seca amarronada o [place]. ? En la [employer], los loquios se detienen [employer] 4 a 6semanas despus del parto, pero esto puede variar.  Cambie los apsitos sanitarios con frecuencia. Observe si hay cambios en el flujo, como: ? Aumento repentino en el volumen. ? Cambio en el color. ? Cogulos de [employer].  Si expulsa un cogulo de sangre por la vagina, gurdelo y llame al mdico. No deseche los cogulos de sangre por el inodoro antes de hablar con su mdico.  No use tampones ni se haga duchas vaginales hasta que el mdico la autorice.  Si no est amamantando, volver a tener su perodo entre 6 y 8 semanas despus del parto. Si solamente alimenta al beb con [employer], podra no volver a tener su perodo hasta que deje de [place]. Cuidados  perineales  Mantenga la zona entre la vagina y el ano (perineo) limpia y Magazine features editor. Utilice apsitos o [employer] y Control and instrumentation engineer, como se lo hayan indicado.  Si le hicieron un corte quirrgico en el perineo (episiotoma) o tuvo un desgarro, controle la zona para detectar signos de infeccin hasta que sane. Est atenta a los siguientes signos: ? Aumento del enrojecimiento, la hinchazn o Chief Technology Officer. ? Presenta lquido o sangre que supura del corte o Insurance account manager. ? Calor. ? Pus o mal olor.  Es posible que le den una botella rociadora para que use en lugar de limpiarse el rea con papel higinico despus de usar el bao. Seque la zona dando golpecitos suaves.  Para aliviar el dolor causado por una episiotoma, un desgarro o venas hinchadas en el ano (hemorroides), tome un bao de asiento tibio 2 o 3 veces por da. En un bao de asiento, el agua tibia solamente debe llegar [employer] caderas y [employer].   Cuidado de las [address]  En los [address] despus del parto, las mamas pueden sentirse pesadas, llenas e incmodas (congestin [place]). Tambin puede escaparse leche de sus senos. Pdale al mdico que le sugiera formas de Acupuncturist.  Si est amamantando: ? Use un sostn que sujete las mamas y [place]. Utilice protectores mamarios para Insurance account manager [employer] se filtre. ? Mantenga los pezones secos y limpios. Aplique cremas y Energy Transfer Partners se lo hayan indicado. ? Puede tener contracciones uterinas cada vez que amamante durante varias semanas despus del [place]. Esto ayuda a que el tero vuelva a su tamao normal. ?  Si tiene algn problema con la lactancia materna, infrmelo al mdico o a un Holiday representative.  Si no est amamantando: ? Evite tocarse las mamas. No extraiga (saque) [employer]. Al hacerlo, podran producir ms leche. ? Use un sostn que le proporcione el ajuste correcto y compresas fras para reducir la hinchazn. Intimidad y sexualidad  Pregntele al  mdico cundo puede retomar la actividad sexual. Esto puede depender de lo siguiente: ? Su riesgo de sufrir infecciones. ? La rapidez con la que est sanando. ? Su comodidad y deseo de retomar la actividad sexual.  Despus del parto, puede quedar embarazada incluso si no ha tenido todava su perodo. Hable con el mdico acerca de los mtodos de control de la natalidad (mtodos anticonceptivos) o planificacin familiar si desea Social research officer, government en el futuro. Medicamentos  Use los medicamentos de venta libre y los recetados solamente como se lo haya indicado el mdico.  Tome un laxante de venta libre para ayudar con las deposiciones como se lo haya indicado el mdico.  Si le recetaron un antibitico, tmelo como se lo haya indicado el mdico. No deje de tomar el antibitico aunque comience a sentirse mejor.  Revise todos los medicamentos con Engineer, civil (consulting) y actuales para comprobar la posible transferencia a la [place]. Actividad  Retome sus actividades normales de a poco segn lo indicado por el mdico.  Descanse todo lo que pueda. Tome siestas mientras el beb duerme. Comida y bebida  Beba suficiente lquido como para Pharmacologist la orina de color amarillo plido.  Para ayudar a prevenir o Educational psychologist, coma alimentos ricos en [employer].  Elija una alimentacin saludable para ayudar a la lactancia o los objetivos de prdida de [place].  Tome sus vitaminas prenatales hasta que su mdico le indique que deje de [place].   Recomendaciones/consejos generales  No consuma ningn producto que contenga nicotina o tabaco. Estos productos incluyen cigarrillos, tabaco para Theatre manager y aparatos de vapeo, como los Administrator, Civil Service. Si necesita ayuda para dejar de fumar, consulte al mdico.  No beba alcohol, especialmente si est amamantando.  No tome medicamentos o frmacos que no se le receten, especialmente si est en perodo de lactancia.  Visite al mdico para  un control de posparto dentro de las primeras 3 a 6 semanas despus del [place].  Realice una visita posparto integral a ms tardar 12 semanas despus del parto.  Asista a todas las visitas de seguimiento para usted y su beb. Comunquese con un mdico si:  Siente tristeza o preocupacin de forma inusual.  Las mamas se ponen rojas, le duelen o se endurecen.  Tiene fiebre u otros signos de infeccin.  Tiene sangrado que est empapando una compresa por hora o tiene cogulos de [place].  Siente un dolor de cabeza intenso que no se [place] o tiene cambios en la visin.  Tiene nuseas y vmitos y no puede comer o beber nada durante 24horas. Solicite ayuda de inmediato si:  Tiene dolor en el pecho o dificultad para respirar.  Tiene un dolor repentino e intenso en la pierna.  Tiene una convulsin o se desmaya.  Tiene pensamientos acerca de lastimarse a usted misma o a su beb. Si alguna vez siente que puede lastimarse o Physicist, medical a Economist, o tiene pensamientos de poner fin a su vida, busque ayuda de inmediato. Dirjase al servicio de urgencias ms cercano o:  Comunquese con el servicio de emergencias de su localidad (911 en los [place]).  National Suicide  Prevention Lifeline (Lnea Telefnica Nacional para la Prevencin del Suicidio) al [phone]. Esta lnea de asistencia al suicida est abierta las 24 horas del da.  Enve un mensaje de texto a la lnea para casos de crisis al [phone] (en los [place].). Resumen  El perodo de tiempo despus el parto y Sara Stokes 6 a 12 semanas despus del parto se denomina perodo posparto.  Asista a todas las visitas de seguimiento para usted y su beb.  Revise todos los medicamentos con Engineer, civil (consulting) y actuales para comprobar la posible transferencia a la [place].  Pngase en contacto con un mdico si se siente inusualmente triste o preocupada durante el perodo posparto. Esta informacin no tiene Theme park manager el  consejo del mdico. Asegrese de hacerle al mdico cualquier pregunta que tenga. Document Revised: 07/12/2020 Document Reviewed: 06/19/2020 Elsevier Patient Education  2021 [employer].

## 2020-10-12 LAB — TYPE AND SCREEN
ABO/RH(D): O POS
Antibody Screen: POSITIVE
Donor AG Type: NEGATIVE
Donor AG Type: NEGATIVE
Donor AG Type: NEGATIVE
Donor AG Type: NEGATIVE
Donor AG Type: NEGATIVE
Donor AG Type: NEGATIVE
Unit division: 0
Unit division: 0
Unit division: 0
Unit division: 0
Unit division: 0
Unit division: 0

## 2020-10-12 LAB — BPAM RBC
Blood Product Expiration Date: 202201262359
Blood Product Expiration Date: 202201282359
Blood Product Expiration Date: 202201282359
Blood Product Expiration Date: 202201302359
Blood Product Expiration Date: 202202012359
Blood Product Expiration Date: 202202032359
ISSUE DATE / TIME: 202201161227
ISSUE DATE / TIME: 202201191659
ISSUE DATE / TIME: 202201191659
Unit Type and Rh: 9500
Unit Type and Rh: 9500
Unit Type and Rh: 9500
Unit Type and Rh: 9500
Unit Type and Rh: 9500
Unit Type and Rh: 9500

## 2020-10-15 LAB — SURGICAL PATHOLOGY

## 2020-10-25 ENCOUNTER — Encounter: Payer: Self-pay | Admitting: Obstetrics and Gynecology

## 2020-10-25 DIAGNOSIS — R7689 Other specified abnormal immunological findings in serum: Secondary | ICD-10-CM | POA: Insufficient documentation

## 2020-10-25 DIAGNOSIS — R768 Other specified abnormal immunological findings in serum: Secondary | ICD-10-CM | POA: Insufficient documentation

## 2022-08-21 LAB — HEPATITIS C ANTIBODY: HCV Ab: NEGATIVE

## 2022-08-21 LAB — OB RESULTS CONSOLE GC/CHLAMYDIA
Chlamydia: NEGATIVE
Neisseria Gonorrhea: NEGATIVE

## 2022-08-21 LAB — OB RESULTS CONSOLE RUBELLA ANTIBODY, IGM: Rubella: IMMUNE

## 2022-08-21 LAB — OB RESULTS CONSOLE HEPATITIS B SURFACE ANTIGEN: Hepatitis B Surface Ag: POSITIVE

## 2022-09-22 NOTE — L&D Delivery Note (Signed)
OB/GYN Faculty Practice Delivery Note  Sara Stokes is a 22 y.o. [phone] s/p SVD at [redacted]w[redacted]d. She was admitted for SOL.   ROM: 0h 58m with clear fluid GBS Status:  Negative/-- (05/10 0000) Maximum Maternal Temperature:  Temp (24hrs), Avg:99.2 F (37.3 C), Min:98.5 F (36.9 C), Max:100.3 F (37.9 C)    Labor Progress: Patient arrived at 5 cm dilation and was induced with AROM.   Delivery Date/Time: 02/12/2023 at 0237 Delivery: Called to room and patient was complete and pushing. Head delivered in LOA position. No nuchal cord present. Shoulder and body delivered in usual fashion. Infant with spontaneous cry, placed on mother's abdomen, dried and stimulated. Cord clamped x 2 after 1-minute delay, and cut by FOB. Cord blood drawn. Placenta delivered spontaneously with gentle cord traction. Fundus firm with massage and Pitocin. Labia, perineum, vagina, and cervix inspected with no lacerations.   Placenta:  spontaneous, intact, 3 vessel cord  Complications: none Lacerations: none EBL: 126 mL Analgesia: epidural    Infant: APGAR (1 MIN): 9   APGAR (5 MINS): 9   APGAR (10 MINS):    Weight: Pending   Derrel Nip, MD  OB Fellow  02/12/2023 2:53 AM

## 2022-12-08 LAB — OB RESULTS CONSOLE RPR
RPR: NONREACTIVE
RPR: NONREACTIVE

## 2022-12-08 LAB — OB RESULTS CONSOLE HIV ANTIBODY (ROUTINE TESTING): HIV: NONREACTIVE

## 2023-01-30 LAB — OB RESULTS CONSOLE GBS: GBS: NEGATIVE

## 2023-02-11 ENCOUNTER — Inpatient Hospital Stay (HOSPITAL_COMMUNITY): Payer: Medicaid Other | Admitting: Anesthesiology

## 2023-02-11 ENCOUNTER — Inpatient Hospital Stay (HOSPITAL_COMMUNITY)
Admission: AD | Admit: 2023-02-11 | Discharge: 2023-02-13 | DRG: 806 | Disposition: A | Discharge status: Home / Self Care | Payer: Medicaid Other | Attending: Obstetrics and Gynecology | Admitting: Obstetrics and Gynecology

## 2023-02-11 ENCOUNTER — Other Ambulatory Visit: Payer: Self-pay

## 2023-02-11 ENCOUNTER — Inpatient Hospital Stay (EMERGENCY_DEPARTMENT_HOSPITAL)
Admission: AD | Admit: 2023-02-11 | Discharge: 2023-02-11 | Disposition: A | Discharge status: Home / Self Care | Payer: Medicaid Other | Source: Home / Self Care | Attending: Obstetrics and Gynecology | Admitting: Obstetrics and Gynecology

## 2023-02-11 ENCOUNTER — Encounter (HOSPITAL_COMMUNITY): Payer: Self-pay | Admitting: Obstetrics and Gynecology

## 2023-02-11 ENCOUNTER — Encounter (HOSPITAL_COMMUNITY): Payer: Self-pay

## 2023-02-11 DIAGNOSIS — O479 False labor, unspecified: Secondary | ICD-10-CM

## 2023-02-11 DIAGNOSIS — O471 False labor at or after 37 completed weeks of gestation: Secondary | ICD-10-CM | POA: Insufficient documentation

## 2023-02-11 DIAGNOSIS — Z87891 Personal history of nicotine dependence: Secondary | ICD-10-CM

## 2023-02-11 DIAGNOSIS — O3663X0 Maternal care for excessive fetal growth, third trimester, not applicable or unspecified: Secondary | ICD-10-CM | POA: Diagnosis present

## 2023-02-11 DIAGNOSIS — Z3A37 37 weeks gestation of pregnancy: Secondary | ICD-10-CM | POA: Insufficient documentation

## 2023-02-11 DIAGNOSIS — O26893 Other specified pregnancy related conditions, third trimester: Secondary | ICD-10-CM | POA: Diagnosis present

## 2023-02-11 DIAGNOSIS — O368130 Decreased fetal movements, third trimester, not applicable or unspecified: Secondary | ICD-10-CM

## 2023-02-11 LAB — CBC
HCT: 38.4 % (ref 36.0–46.0)
Hemoglobin: 13.1 g/dL (ref 12.0–15.0)
MCH: 30 pg (ref 26.0–34.0)
MCHC: 34.1 g/dL (ref 30.0–36.0)
MCV: 88.1 fL (ref 80.0–100.0)
Platelets: 267 10*3/uL (ref 150–400)
RBC: 4.36 MIL/uL (ref 3.87–5.11)
RDW: 13.5 % (ref 11.5–15.5)
WBC: 17.7 10*3/uL — ABNORMAL HIGH (ref 4.0–10.5)
nRBC: 0 % (ref 0.0–0.2)

## 2023-02-11 MED ORDER — DIPHENHYDRAMINE HCL 50 MG/ML IJ SOLN: 12.5000 mg | INTRAMUSCULAR | Status: DC | PRN

## 2023-02-11 MED ORDER — ACETAMINOPHEN 325 MG PO TABS: 650.0000 mg | ORAL_TABLET | ORAL | Status: DC | PRN

## 2023-02-11 MED ORDER — FENTANYL-BUPIVACAINE-NACL 0.5-0.125-0.9 MG/250ML-% EP SOLN
EPIDURAL | Status: AC
  Filled 2023-02-11: qty 250

## 2023-02-11 MED ORDER — ONDANSETRON HCL 4 MG/2ML IJ SOLN: 4.0000 mg | Freq: Four times a day (QID) | INTRAMUSCULAR | Status: DC | PRN

## 2023-02-11 MED ORDER — SOD CITRATE-CITRIC ACID 500-334 MG/5ML PO SOLN: 30.0000 mL | ORAL | Status: DC | PRN

## 2023-02-11 MED ORDER — LACTATED RINGERS IV SOLN: 500.0000 mL | INTRAVENOUS | Status: DC | PRN

## 2023-02-11 MED ORDER — OXYTOCIN BOLUS FROM INFUSION
333.0000 mL | Freq: Once | INTRAVENOUS | Status: AC
  Administered 2023-02-12: 333 mL via INTRAVENOUS

## 2023-02-11 MED ORDER — PHENYLEPHRINE 80 MCG/ML (10ML) SYRINGE FOR IV PUSH (FOR BLOOD PRESSURE SUPPORT): 80.0000 ug | PREFILLED_SYRINGE | INTRAVENOUS | Status: DC | PRN

## 2023-02-11 MED ORDER — OXYCODONE-ACETAMINOPHEN 5-325 MG PO TABS: 2.0000 | ORAL_TABLET | ORAL | Status: DC | PRN

## 2023-02-11 MED ORDER — LACTATED RINGERS IV BOLUS
1000.0000 mL | Freq: Once | INTRAVENOUS | Status: AC
  Administered 2023-02-11: 1000 mL via INTRAVENOUS

## 2023-02-11 MED ORDER — EPHEDRINE 5 MG/ML INJ: 10.0000 mg | INTRAVENOUS | Status: DC | PRN

## 2023-02-11 MED ORDER — OXYCODONE-ACETAMINOPHEN 5-325 MG PO TABS
1.0000 | ORAL_TABLET | ORAL | Status: DC | PRN
Start: 2023-02-11 — End: 2023-02-12

## 2023-02-11 MED ORDER — LIDOCAINE HCL (PF) 1 % IJ SOLN: 30.0000 mL | INTRAMUSCULAR | Status: DC | PRN

## 2023-02-11 MED ORDER — OXYTOCIN-SODIUM CHLORIDE 30-0.9 UT/500ML-% IV SOLN
2.5000 [IU]/h | INTRAVENOUS | Status: DC
Start: 2023-02-11 — End: 2023-02-12
  Filled 2023-02-11: qty 500

## 2023-02-11 MED ORDER — FENTANYL-BUPIVACAINE-NACL 0.5-0.125-0.9 MG/250ML-% EP SOLN
12.0000 mL/h | EPIDURAL | Status: DC | PRN
  Administered 2023-02-11: 12 mL/h via EPIDURAL

## 2023-02-11 MED ORDER — LACTATED RINGERS IV SOLN
500.0000 mL | Freq: Once | INTRAVENOUS | Status: AC
  Administered 2023-02-11: 500 mL via INTRAVENOUS

## 2023-02-11 MED ORDER — LACTATED RINGERS IV SOLN: INTRAVENOUS | Status: DC

## 2023-02-11 MED ORDER — LIDOCAINE HCL (PF) 1 % IJ SOLN
INTRAMUSCULAR | Status: DC | PRN
  Administered 2023-02-11: 11 mL via EPIDURAL

## 2023-02-11 NOTE — H&P (Signed)
OBSTETRIC ADMISSION HISTORY AND PHYSICAL  Sara Stokes is a 22 y.o. female [phone] with IUP at [redacted]w[redacted]d by early [place] presenting for SOL. She reports +FMs, No LOF, no VB, no blurry vision, headaches or peripheral edema, and RUQ pain.  She plans on formula feeding. She request Depo for birth control. She received her prenatal care [facility]   Dating: By Early [place] --->  Estimated Date of Delivery: 02/26/23  Sono:    @[redacted]w[redacted]d , CWD, normal anatomy, cephalic presentation, anterior placenta, 452 g, >97% EFW   Prenatal History/Complications: LGA  Past Medical History: Past Medical History:  Diagnosis Date   Chlamydia 12/25/2017   tx 11/2017   Medical history non-contributory     Past Surgical History: Past Surgical History:  Procedure Laterality Date   NO PAST SURGERIES      Obstetrical History: OB History     Gravida  4   Para  2   Term  2   Preterm      AB  1   Living  2      SAB  1   IAB      Ectopic      Multiple  0   Live Births  2           Social History Social History   Socioeconomic History   Marital status: Single    Spouse name: Not on file   Number of children: Not on file   Years of education: Not on file   Highest education level: Not on file  Occupational History   Not on file  Tobacco Use   Smoking status: Former    Types: Cigarettes    Quit date: 12/19/2017    Years since quitting: 5.1   Smokeless tobacco: Never   Tobacco comments:    Marijuana  Substance and Sexual Activity   Alcohol use: Not Currently   Drug use: Not Currently    Types: Marijuana   Sexual activity: Not Currently    Birth control/protection: None  Other Topics Concern   Not on file  Social History Narrative   Not on file   Social Determinants of Health   Financial Resource Strain: Not on file  Food Insecurity: Not on file  Transportation Needs: Not on file  Physical Activity: Not on file  Stress: Not on file  Social Connections: Not on file     Family History: History reviewed. No pertinent family history.  Allergies: No Known Allergies  Medications Prior to Admission  Medication Sig Dispense Refill Last Dose   acetaminophen (TYLENOL) 500 MG tablet Take 2 tablets (1,000 mg total) by mouth every 6 (six) hours as needed for mild pain or moderate pain. 30 tablet 0    coconut oil OIL Apply 1 application topically as needed.  0    ibuprofen (ADVIL) 600 MG tablet Take 1 tablet (600 mg total) by mouth every 6 (six) hours as needed for mild pain or moderate pain.      Prenatal Vit-Fe Fumarate-FA (PRENATAL VITAMIN PO) Take by mouth.        Review of Systems   All systems reviewed and negative except as stated in HPI  Blood pressure 135/80, pulse (!) 135, temperature 100.3 F (37.9 C), temperature source Oral, resp. rate 19, SpO2 96 %, unknown if currently breastfeeding. General appearance: alert, cooperative, and appears stated age Lungs: normal work of breathing  Heart: regular rate  Abdomen: soft, non-tender; gravid  Presentation: cephalic Fetal monitoringBaseline: 140 bpm,  Variability: Good {> 6 bpm), Accelerations: Reactive, and Decelerations: Absent Uterine activityevery 4 min Dilation: 7 Effacement (%): 80 Station: -1 Exam by:: Anastasio Champion, RN   Prenatal labs: ABO, Rh:  O pos Antibody:  Neg Rubella:  Immune RPR:   NR HBsAg:   NR HIV:   NR GBS:   Neg 1 hr Glucola NL Genetic screening  LR female Anatomy US >97 % EFW  Prenatal Transfer Tool  Maternal Diabetes: No Genetic Screening: Normal Maternal Ultrasounds/Referrals: Normal Fetal Ultrasounds or other Referrals:  None Maternal Substance Abuse:  No Significant Maternal Medications:  None Significant Maternal Lab Results:  Group B Strep negative Number of Prenatal Visits:greater than 3 verified prenatal visits Other Comments:  None  No results found for this or any previous visit (from the past 24 hour(s)).  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 [id number] at [redacted]w[redacted]d here for SOL  #Labor:Progressing spontaneously  #Pain: Epidural  #FWB: Cat 1 #ID:  GBS neg #MOF: Breast  #MOC:Depo  #Circ:  no  Celedonio Savage, MD  02/11/2023, 10:30 PM

## 2023-02-11 NOTE — MAU Note (Signed)
.  Sara Stokes is a 22 y.o. at [redacted]w[redacted]d here in MAU reporting: contractions q 5 minutes since this am. Denies bleeding or ROM. Reports decreased fetal movement today.   Onset of complaint: today Pain score: 4/10 Vitals:   02/11/23 1318  BP: 121/79  Pulse: (!) 127  Resp: 17  Temp: 98.5 F (36.9 C)  SpO2: 99%     FHT:167 Lab orders placed from triage:

## 2023-02-11 NOTE — MAU Note (Signed)
.  Sara Stokes is a 22 y.o. at [redacted]w[redacted]d here in MAU reporting:  Pt was here earlier in MAU went from 4cm to 5cm cervical change. Pt states contractions became even more intense.   No LOF, NO bleeding. +FM.  Vitals:   02/11/23 2219 02/11/23 2220  BP:  135/80  Pulse:  (!) 135  Resp:  19  Temp: 100.3 F (37.9 C) 100.3 F (37.9 C)  SpO2:  96%      Pain score: 7/10 back pain and contractions.   FHT:168 Lab orders placed from triage:   labor eval.

## 2023-02-11 NOTE — Anesthesia Procedure Notes (Signed)
Epidural Patient location during procedure: OB Start time: 02/11/2023 11:12 PM End time: 02/11/2023 11:24 PM  Staffing Anesthesiologist: Lowella Curb, MD Performed: anesthesiologist   Preanesthetic Checklist Completed: patient identified, IV checked, site marked, risks and benefits discussed, surgical consent, monitors and equipment checked, pre-op evaluation and timeout performed  Epidural Patient position: sitting Prep: ChloraPrep Patient monitoring: heart rate, cardiac monitor, continuous pulse ox and blood pressure Approach: midline Location: L2-L3 Injection technique: LOR saline  Needle:  Needle type: Tuohy  Needle gauge: 17 G Needle length: 9 cm Needle insertion depth: 6 cm Catheter type: closed end flexible Catheter size: 20 Guage Catheter at skin depth: 10 cm Test dose: negative  Assessment Events: blood not aspirated, injection not painful, no injection resistance, no paresthesia and negative IV test  Additional Notes Reason for block:procedure for pain

## 2023-02-11 NOTE — MAU Provider Note (Signed)
History     [id number]  Arrival date and time: 02/11/23 1256    Chief Complaint  Patient presents with   Contractions     HPI Sara Stokes is a 22 y.o. at [redacted]w[redacted]d with PMHx notable for uterine inversion, who presents for contractions.   Patient reports contractions started earlier today Initially reported decreased fetal movement On further questioning in Spanish she reports movement frequency is unchanged, its more that baby seems to have less room She denies vaginal bleeding or leaking of fluid       OB History     Gravida  4   Para  2   Term  2   Preterm      AB  1   Living  2      SAB  1   IAB      Ectopic      Multiple  0   Live Births  2           Past Medical History:  Diagnosis Date   Chlamydia 12/25/2017   tx 11/2017   Medical history non-contributory     Past Surgical History:  Procedure Laterality Date   NO PAST SURGERIES      History reviewed. No pertinent family history.  Social History   Socioeconomic History   Marital status: Single    Spouse name: Not on file   Number of children: Not on file   Years of education: Not on file   Highest education level: Not on file  Occupational History   Not on file  Tobacco Use   Smoking status: Former    Types: Cigarettes    Quit date: 12/19/2017    Years since quitting: 5.1   Smokeless tobacco: Never   Tobacco comments:    Marijuana  Substance and Sexual Activity   Alcohol use: Not Currently   Drug use: Not Currently    Types: Marijuana   Sexual activity: Not Currently    Birth control/protection: None  Other Topics Concern   Not on file  Social History Narrative   Not on file   Social Determinants of Health   Financial Resource Strain: Not on file  Food Insecurity: Not on file  Transportation Needs: Not on file  Physical Activity: Not on file  Stress: Not on file  Social Connections: Not on file  Intimate Partner Violence: Not on file    No Known  Allergies  No current facility-administered medications on file prior to encounter.   Current Outpatient Medications on File Prior to Encounter  Medication Sig Dispense Refill   Prenatal Vit-Fe Fumarate-FA (PRENATAL VITAMIN PO) Take by mouth.     acetaminophen (TYLENOL) 500 MG tablet Take 2 tablets (1,000 mg total) by mouth every 6 (six) hours as needed for mild pain or moderate pain. 30 tablet 0   coconut oil OIL Apply 1 application topically as needed.  0   ibuprofen (ADVIL) 600 MG tablet Take 1 tablet (600 mg total) by mouth every 6 (six) hours as needed for mild pain or moderate pain.       ROS Pertinent positives and negative per HPI, all others reviewed and negative  Physical Exam   BP 120/67   Pulse (!) 117   Temp 98.5 F (36.9 C)   Resp 17   Ht 5' 4.5" (1.638 m)   Wt 94.1 kg   SpO2 97%   BMI 35.07 kg/m   Patient Vitals for the past 24 hrs:  BP  Temp Pulse Resp SpO2 Height Weight  02/11/23 1519 120/67 -- (!) 117 -- -- -- --  02/11/23 1440 -- -- (!) 120 -- 97 % -- --  02/11/23 1318 121/79 98.5 F (36.9 C) (!) 127 17 99 % 5' 4.5" (1.638 m) 94.1 kg    Physical Exam Vitals reviewed.  Constitutional:      General: She is not in acute distress.    Appearance: She is well-developed. She is not diaphoretic.  Eyes:     General: No scleral icterus. Pulmonary:     Effort: Pulmonary effort is normal. No respiratory distress.  Abdominal:     General: There is no distension.     Palpations: Abdomen is soft.     Tenderness: There is no abdominal tenderness. There is no guarding or rebound.  Skin:    General: Skin is warm and dry.  Neurological:     Mental Status: She is alert.     Coordination: Coordination normal.      Cervical Exam Dilation: 5 Effacement (%): 80 Station: -1 Presentation: Vertex Exam by:: Simmie Davies, RN  Bedside Ultrasound Not performed.  My interpretation: n/a  FHT Baseline: 160 bpm Variability: Good {> 6 bpm) Accelerations:  Reactive Decelerations: one possible decel but suspect tracing maternal Uterine activity: initially q3-4 min but then spaced out Cat: I  Labs No results found for this or any previous visit (from the past 24 hour(s)).  Imaging No results found.  MAU Course  Procedures Lab Orders  No laboratory test(s) ordered today   Meds ordered this encounter  Medications   lactated ringers bolus 1,000 mL   Imaging Orders  No imaging studies ordered today    MDM Moderate (Level 3-4)  Assessment and Plan  #Decreased fetal movement, resolved #[redacted] weeks gestation of pregnancy NST reactive. Numerous clicks on strip.   #False labor Minimal cervical change and spacing contractions. Given option of ongoing observation and recheck vs going home, she chose the latter.   #FWB FHT Cat I NST: Reactive   Dispo: discharged to home in stable condition    Venora Maples, MD/MPH 02/11/23 4:02 PM  Allergies as of 02/11/2023   No Known Allergies      Medication List     TAKE these medications    acetaminophen 500 MG tablet Commonly known as: TYLENOL Take 2 tablets (1,000 mg total) by mouth every 6 (six) hours as needed for mild pain or moderate pain.   coconut oil Oil Apply 1 application topically as needed.   ibuprofen 600 MG tablet Commonly known as: ADVIL Take 1 tablet (600 mg total) by mouth every 6 (six) hours as needed for mild pain or moderate pain.   PRENATAL VITAMIN PO Take by mouth.

## 2023-02-11 NOTE — Anesthesia Preprocedure Evaluation (Signed)
Anesthesia Evaluation  Patient identified by MRN, date of birth, ID band Patient awake    Reviewed: Allergy & Precautions, H&P , NPO status , Patient's Chart, lab work & pertinent test results, reviewed documented beta blocker date and time   Airway Mallampati: II  TM Distance: >3 FB Neck ROM: full    Dental no notable dental hx.    Pulmonary neg pulmonary ROS, former smoker   Pulmonary exam normal breath sounds clear to auscultation       Cardiovascular negative cardio ROS Normal cardiovascular exam Rhythm:regular Rate:Normal     Neuro/Psych negative neurological ROS  negative psych ROS   GI/Hepatic negative GI ROS, Neg liver ROS,,,  Endo/Other  negative endocrine ROS    Renal/GU negative Renal ROS  negative genitourinary   Musculoskeletal   Abdominal  (+) + obese  Peds  Hematology negative hematology ROS (+)   Anesthesia Other Findings   Reproductive/Obstetrics (+) Pregnancy                             Anesthesia Physical Anesthesia Plan  ASA: II  Anesthesia Plan: Epidural   Post-op Pain Management:    Induction:   PONV Risk Score and Plan:   Airway Management Planned:   Additional Equipment:   Intra-op Plan:   Post-operative Plan:   Informed Consent: I have reviewed the patients History and Physical, chart, labs and discussed the procedure including the risks, benefits and alternatives for the proposed anesthesia with the patient or authorized representative who has indicated his/her understanding and acceptance.       Plan Discussed with:   Anesthesia Plan Comments:         Anesthesia Quick Evaluation

## 2023-02-12 ENCOUNTER — Encounter (HOSPITAL_COMMUNITY): Payer: Self-pay | Admitting: Obstetrics and Gynecology

## 2023-02-12 DIAGNOSIS — Z3A37 37 weeks gestation of pregnancy: Secondary | ICD-10-CM

## 2023-02-12 LAB — BPAM RBC

## 2023-02-12 LAB — RPR: RPR Ser Ql: NONREACTIVE

## 2023-02-12 MED ORDER — SIMETHICONE 80 MG PO CHEW: 80.0000 mg | CHEWABLE_TABLET | ORAL | Status: DC | PRN

## 2023-02-12 MED ORDER — WITCH HAZEL-GLYCERIN EX PADS: 1.0000 | MEDICATED_PAD | CUTANEOUS | Status: DC | PRN

## 2023-02-12 MED ORDER — DIBUCAINE (PERIANAL) 1 % EX OINT: 1.0000 | TOPICAL_OINTMENT | CUTANEOUS | Status: DC | PRN

## 2023-02-12 MED ORDER — SENNOSIDES-DOCUSATE SODIUM 8.6-50 MG PO TABS
2.0000 | ORAL_TABLET | ORAL | Status: DC
  Administered 2023-02-13: 2 via ORAL
  Filled 2023-02-12 (×2): qty 2

## 2023-02-12 MED ORDER — ONDANSETRON HCL 4 MG/2ML IJ SOLN: 4.0000 mg | INTRAMUSCULAR | Status: DC | PRN

## 2023-02-12 MED ORDER — IBUPROFEN 600 MG PO TABS
600.0000 mg | ORAL_TABLET | Freq: Four times a day (QID) | ORAL | Status: DC
  Administered 2023-02-12 – 2023-02-13 (×6): 600 mg via ORAL
  Filled 2023-02-12 (×6): qty 1

## 2023-02-12 MED ORDER — DIPHENHYDRAMINE HCL 25 MG PO CAPS: 25.0000 mg | ORAL_CAPSULE | Freq: Four times a day (QID) | ORAL | Status: DC | PRN

## 2023-02-12 MED ORDER — PRENATAL MULTIVITAMIN CH
1.0000 | ORAL_TABLET | Freq: Every day | ORAL | Status: DC
  Administered 2023-02-13: 1 via ORAL
  Filled 2023-02-12 (×2): qty 1

## 2023-02-12 MED ORDER — ONDANSETRON HCL 4 MG PO TABS: 4.0000 mg | ORAL_TABLET | ORAL | Status: DC | PRN

## 2023-02-12 MED ORDER — COCONUT OIL OIL: 1.0000 | TOPICAL_OIL | Status: DC | PRN

## 2023-02-12 MED ORDER — BENZOCAINE-MENTHOL 20-0.5 % EX AERO
1.0000 | INHALATION_SPRAY | CUTANEOUS | Status: DC | PRN
  Administered 2023-02-13: 1 via TOPICAL
  Filled 2023-02-12: qty 56

## 2023-02-12 MED ORDER — TETANUS-DIPHTH-ACELL PERTUSSIS 5-2.5-18.5 LF-MCG/0.5 IM SUSY: 0.5000 mL | PREFILLED_SYRINGE | Freq: Once | INTRAMUSCULAR | Status: DC

## 2023-02-12 MED ORDER — ZOLPIDEM TARTRATE 5 MG PO TABS: 5.0000 mg | ORAL_TABLET | Freq: Every evening | ORAL | Status: DC | PRN

## 2023-02-12 MED ORDER — ACETAMINOPHEN 325 MG PO TABS: 650.0000 mg | ORAL_TABLET | ORAL | Status: DC | PRN

## 2023-02-12 NOTE — Discharge Summary (Signed)
Postpartum Discharge Summary    Patient Name: Sara Stokes DOB: 09-19-2001 MRN: [id number]  Date of admission: 02/11/2023 Delivery date:02/12/2023  Delivering provider: Celedonio Savage  Date of discharge: 02/13/2023  Admitting diagnosis: Indication for care in labor or delivery [O75.9] Intrauterine pregnancy: [redacted]w[redacted]d     Secondary diagnosis:  Principal Problem:   Indication for care in labor or delivery Active Problems:   Labor and delivery, indication for care   Vaginal delivery  Additional problems: n/a    Discharge diagnosis: Term Pregnancy Delivered                                              Post partum procedures: n/a Augmentation: AROM Complications: None  Hospital course: Onset of Labor With Vaginal Delivery      22 y.o. yo [id number] at [redacted]w[redacted]d was admitted in Active Labor on 02/11/2023. Labor course was uncomplicated.  Membrane Rupture Time/Date: 2:09 AM ,02/12/2023   Delivery Method:Vaginal, Spontaneous  Episiotomy: None  Lacerations:  None  Patient had a postpartum course complicated by  none.  She is ambulating, tolerating a regular diet, passing flatus, and urinating well. Patient is discharged home in stable condition on 02/13/23.  Newborn Data: Birth date:02/12/2023  Birth time:2:37 AM  Gender:Female  Living status:Living  Apgars:9 ,9  Weight:3600 g   Magnesium Sulfate received: No BMZ received: No Rhophylac:N/A MMR:No T-DaP: ordered postpartum Flu: N/A Transfusion:No  Physical exam  Vitals:   02/12/23 0910 02/12/23 1350 02/12/23 2000 02/13/23 0505  BP: 116/82 108/60 125/73 104/72  Pulse: (!) 104 (!) 106 99 (!) 101  Resp: 18 17 18 16   Temp: 98.3 F (36.8 C)  98.1 F (36.7 C) 98 F (36.7 C)  TempSrc:   Oral Oral  SpO2: 98% 99% 100% 99%   General: alert, cooperative, and no distress Lochia: appropriate Uterine Fundus: firm Incision: N/A DVT Evaluation: No evidence of DVT seen on physical exam. Labs: Lab Results  Component Value Date    WBC 17.7 (H) 02/11/2023   HGB 13.1 02/11/2023   HCT 38.4 02/11/2023   MCV 88.1 02/11/2023   PLT 267 02/11/2023      Latest Ref Rng & Units 10/10/2020    8:08 AM  CMP  Glucose 70 - 99 mg/dL 98   BUN 6 - 20 mg/dL 7   Creatinine 1.47 - 8.29 mg/dL 5.62   Sodium 130 - 865 mmol/L 134   Potassium 3.5 - 5.1 mmol/L 3.8   Chloride 98 - 111 mmol/L 107   CO2 22 - 32 mmol/L 18   Calcium 8.9 - 10.3 mg/dL 8.5   Total Protein 6.5 - 8.1 g/dL 6.3   Total Bilirubin 0.3 - 1.2 mg/dL 0.6   Alkaline Phos 38 - 126 U/L 181   AST 15 - 41 U/L 24   ALT 0 - 44 U/L 23    Edinburgh Score:    02/13/2023    2:40 AM  Edinburgh Postnatal Depression Scale Screening Tool  I have been able to laugh and see the funny side of things. 0  I have looked forward with enjoyment to things. 0  I have blamed myself unnecessarily when things went wrong. 0  I have been anxious or worried for no good reason. 0  I have felt scared or panicky for no good reason. 0  Things have been getting on top of me.  0  I have been so unhappy that I have had difficulty sleeping. 1  I have felt sad or miserable. 0  I have been so unhappy that I have been crying. 0  The thought of harming myself has occurred to me. 0  Edinburgh Postnatal Depression Scale Total 1     After visit meds:  Allergies as of 02/13/2023   No Known Allergies      Medication List     STOP taking these medications    coconut oil Oil   PRENATAL VITAMIN PO       TAKE these medications    acetaminophen 325 MG tablet Commonly known as: Tylenol Take 2 tablets (650 mg total) by mouth every 4 (four) hours as needed (for pain scale < 4). What changed:  medication strength how much to take when to take this reasons to take this   benzocaine-Menthol 20-0.5 % Aero Commonly known as: DERMOPLAST Apply 1 Application topically as needed for irritation (perineal discomfort).   ibuprofen 600 MG tablet Commonly known as: ADVIL Take 1 tablet (600 mg  total) by mouth every 6 (six) hours. What changed:  when to take this reasons to take this   senna-docusate 8.6-50 MG tablet Commonly known as: Senokot-S Take 2 tablets by mouth daily. Start taking on: Feb 14, 2023         Discharge home in stable condition Infant Feeding: Bottle and Breast Infant Disposition:home with mother Discharge instruction: per After Visit Summary and Postpartum booklet. Activity: Advance as tolerated. Pelvic rest for 6 weeks.  Diet: routine diet Future Appointments:No future appointments. Follow up Visit:   Please schedule this patient for a In person postpartum visit in 6 weeks with the following provider: Any provider. Additional Postpartum F/U: None   Low risk pregnancy complicated by:  NA Delivery mode:  Vaginal, Spontaneous  Anticipated Birth Control:  Depo   02/13/2023 Myrtie Hawk, DO

## 2023-02-12 NOTE — Lactation Note (Signed)
This note was copied from a baby's chart. Lactation Consultation Note  Patient Name: Sara Stokes [id number] Date: 02/12/2023 Age:23 hours Reason for consult: Initial assessment;Early term 37-38.6wks [facility] Spanish interpreter confirmed with mom she declined and interpreter and doesn't need one. Mom aware to call with feeding cues.) Per mom last tried to feed at the breast at 11 am for 5 min.  Presently grandmother is holding baby and he is asleep.  Baby only has had a large smear.  LC reviewed supply and demand, importance of giving the baby the opportunity to bring the milk in.  LC recommended to feed with feeding cues and by 3 hours to offer the breast, if the baby is still hungry after the 1st breast offer the 2nd breast.  If the baby is satisfied hold off on the formula.  If supplementing keep it 30 ml or below.   Maternal Data Does the patient have breastfeeding experience prior to this delivery?: Yes How long did the patient breastfeed?: per mom breast fed 1 1/2 yers , 2nd baby 1 year, 3rd , 1 year  Feeding Mother's Current Feeding Choice: Breast Milk and Formula   Interventions  Education   Discharge [facility] Program: Yes  Consult Status Consult Status: Follow-up Date: 02/12/23 Follow-up type: In-patient    [name] [name] 02/12/2023, 12:12 PM

## 2023-02-12 NOTE — Anesthesia Postprocedure Evaluation (Signed)
Anesthesia Post Note  Patient: Sara Stokes  Procedure(s) Performed: AN AD HOC LABOR EPIDURAL     Patient location during evaluation: Mother Baby Anesthesia Type: Epidural Level of consciousness: awake and alert Pain management: pain level controlled Vital Signs Assessment: post-procedure vital signs reviewed and stable Respiratory status: spontaneous breathing, nonlabored ventilation and respiratory function stable Cardiovascular status: stable Postop Assessment: no headache, no backache and epidural receding Anesthetic complications: no   No notable events documented.  Last Vitals:  Vitals:   02/12/23 0438 02/12/23 0540  BP: 119/77 119/83  Pulse: (!) 107 (!) 102  Resp: 18 18  Temp: 37 C 36.7 C  SpO2: 99% 99%    Last Pain:  Vitals:   02/12/23 0540  TempSrc: Oral  PainSc: 0-No pain   Pain Goal:                   Salome Arnt

## 2023-02-12 NOTE — Progress Notes (Signed)
LABOR PROGRESS NOTE  Sara Stokes is a 22 y.o. [phone] at [redacted]w[redacted]d  admitted for SOL  Subjective: Uncomfortable through contractions   Objective: BP 116/68   Pulse (!) 106   Temp 98.6 F (37 C) (Oral)   Resp 19   SpO2 100%  or  Vitals:   02/12/23 0120 02/12/23 0159 02/12/23 0200 02/12/23 0206  BP: 101/61 119/65 119/65 116/68  Pulse: (!) 106 (!) 102 (!) 102 (!) 106  Resp:      Temp:      TempSrc:      SpO2: 99%  99% 100%     Dilation: 8 Effacement (%): 90 Cervical Position: Posterior Station: 0 Presentation: Vertex Exam by:: Dr. 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 [id number] at [redacted]w[redacted]d here for SOL  Labor: Progressing well. AROM with clear fluid Fetal Wellbeing:  Cat 1 Pain Control:  Epidural in place but not working well at this time  Anticipated MOD:  vaginal   Derrel Nip, MD  OB Fellow  02/12/2023, 2:29 AM

## 2023-02-13 ENCOUNTER — Other Ambulatory Visit (HOSPITAL_COMMUNITY): Payer: Self-pay

## 2023-02-13 DIAGNOSIS — Z3A37 37 weeks gestation of pregnancy: Secondary | ICD-10-CM | POA: Diagnosis not present

## 2023-02-13 MED ORDER — SENNOSIDES-DOCUSATE SODIUM 8.6-50 MG PO TABS
2.0000 | ORAL_TABLET | ORAL | 0 refills | Status: AC
  Filled 2023-02-13: qty 30, 15d supply, fill #0

## 2023-02-13 MED ORDER — MEDROXYPROGESTERONE ACETATE 150 MG/ML IM SUSP
150.0000 mg | Freq: Once | INTRAMUSCULAR | Status: AC
  Administered 2023-02-13: 150 mg via INTRAMUSCULAR
  Filled 2023-02-13: qty 1

## 2023-02-13 MED ORDER — BENZOCAINE-MENTHOL 20-0.5 % EX AERO
1.0000 | INHALATION_SPRAY | CUTANEOUS | 0 refills | Status: AC | PRN
  Filled 2023-02-13: qty 78, fill #0

## 2023-02-13 MED ORDER — ACETAMINOPHEN 325 MG PO TABS
650.0000 mg | ORAL_TABLET | ORAL | 0 refills | Status: AC | PRN
  Filled 2023-02-13: qty 100, 9d supply, fill #0

## 2023-02-13 MED ORDER — IBUPROFEN 600 MG PO TABS
600.0000 mg | ORAL_TABLET | Freq: Four times a day (QID) | ORAL | 0 refills | Status: AC
  Filled 2023-02-13: qty 30, 8d supply, fill #0

## 2023-02-14 LAB — BPAM RBC
Blood Product Expiration Date: 202406072359
Blood Product Expiration Date: 202406082359
Unit Type and Rh: 5100
Unit Type and Rh: 5100

## 2023-02-14 LAB — TYPE AND SCREEN
ABO/RH(D): O POS
Antibody Screen: NEGATIVE
Donor AG Type: NEGATIVE
Donor AG Type: NEGATIVE
Unit division: 0
Unit division: 0

## 2023-02-20 ENCOUNTER — Telehealth (HOSPITAL_COMMUNITY): Payer: Self-pay | Admitting: *Deleted

## 2023-02-20 NOTE — Telephone Encounter (Signed)
Mom reports feeling good. No concerns about herself at this time. Feels well emotionally [facility] score=1) Mom reports baby is doing well. Feeding, peeing, and pooping without difficulty. Safe sleep reviewed. Mom reports yellowish discharge from eyes. Discussed using warm wet washcloth to massage lower inner corner of eyes and then calling pediatrician for guidance if it continues. no other concerns about baby at present.  Duffy Rhody, RN 02-20-2023 at 11:38am
